# Patient Record
Sex: Male | Born: 1970 | Race: White | Hispanic: No | Marital: Married | Smoking: Former smoker
Health system: Southern US, Community
[De-identification: ages and names within clinical notes are randomized; demographics above are authoritative.]

## PROBLEM LIST (undated history)

## (undated) DIAGNOSIS — Z789 Other specified health status: Secondary | ICD-10-CM

## (undated) DIAGNOSIS — Z460 Encounter for fitting and adjustment of spectacles and contact lenses: Secondary | ICD-10-CM

## (undated) HISTORY — PX: WISDOM TOOTH EXTRACTION: SHX21

## (undated) HISTORY — PX: APPENDECTOMY: SHX54

---

## 2003-11-19 ENCOUNTER — Ambulatory Visit (HOSPITAL_COMMUNITY): Admission: RE | Admit: 2003-11-19 | Discharge: 2003-11-19 | Payer: Self-pay | Admitting: Family Medicine

## 2004-07-01 HISTORY — PX: KNEE ARTHROSCOPY: SUR90

## 2007-01-14 ENCOUNTER — Ambulatory Visit (HOSPITAL_COMMUNITY): Admission: RE | Admit: 2007-01-14 | Discharge: 2007-01-14 | Payer: Self-pay | Admitting: Family Medicine

## 2013-06-10 ENCOUNTER — Other Ambulatory Visit: Payer: Self-pay | Admitting: Family Medicine

## 2013-06-10 DIAGNOSIS — M25562 Pain in left knee: Secondary | ICD-10-CM

## 2013-06-14 ENCOUNTER — Ambulatory Visit
Admission: RE | Admit: 2013-06-14 | Discharge: 2013-06-14 | Disposition: A | Discharge status: Home / Self Care | Payer: BC Managed Care – PPO | Source: Ambulatory Visit | Attending: Family Medicine | Admitting: Family Medicine

## 2013-06-14 DIAGNOSIS — M25562 Pain in left knee: Secondary | ICD-10-CM

## 2013-06-18 ENCOUNTER — Other Ambulatory Visit: Payer: Self-pay | Admitting: Orthopedic Surgery

## 2013-06-21 NOTE — H&P (Signed)
Logan Moore is an 42 y.o. male.   Chief Complaint: Left knee Pain  HPI: Patient is seen in consultation from Dr. Althea Moore for an MRI proven left knee medial meniscal tear that is complex and relatively large in size.  He is limited this for many months he has had various forms of conservative treatment and unfortunately his pain and catching persists, he is here and she isn't having arthroscopic surgery on his left knee.  He had a similar procedure on his right knee around 2007 and did well.  No past medical history on file.  No past surgical history on file.  No family history on file. Social History:  has no tobacco, alcohol, and drug history on file.  Allergies: Allergies not on file  No prescriptions prior to admission    No results found for this or any previous visit (from the past 48 hour(s)). No results found.  Review of Systems  Constitutional: Negative.   HENT: Negative.   Eyes: Negative.   Respiratory: Negative.   Cardiovascular: Negative.   Gastrointestinal: Negative.   Genitourinary: Negative.   Musculoskeletal: Positive for joint pain.  Skin: Negative.   Neurological: Negative.   Endo/Heme/Allergies: Negative.   Psychiatric/Behavioral: Negative.     There were no vitals taken for this visit. Physical Exam  Constitutional: He is oriented to person, place, and time. He appears well-developed and well-nourished.  HENT:  Head: Normocephalic and atraumatic.  Eyes: Pupils are equal, round, and reactive to light.  Neck: Normal range of motion. Neck supple.  Cardiovascular: Intact distal pulses.   Respiratory: Effort normal.  Musculoskeletal:  Tender along the medial joint line of the left knee 1+ effusion McMurray's test reproduces pain without any specific click.  Collateral ligaments are stable Lachman's test is negative.  I did call up the MRI scan on the computer and he doesn't fact have a fairly large complex medial meniscus tear that is visible on 4 or 5 cuts  on the sagittal views.  He is neurovascularly intact.  Neurological: He is alert and oriented to person, place, and time.  Skin: Skin is warm and dry.  Psychiatric: He has a normal mood and affect. His behavior is normal. Judgment and thought content normal.     Assessment/Plan Assess: Large complex left knee medial meniscal tear that has had a full course of conservative treatment and remains symptomatic  Plan: Risks and benefits of arthroscopic surgery discussed and reinforced with the patient.  I will see him back at the time of surgical intervention.  He can anticipate being on light duty for the first week or 2 after the surgery.  Fortunately his job does involve sitting and standing and some driving but he has control over how much.  Logan Moore R 06/21/2013, 2:59 PM

## 2013-06-22 ENCOUNTER — Encounter (HOSPITAL_BASED_OUTPATIENT_CLINIC_OR_DEPARTMENT_OTHER): Payer: Self-pay | Admitting: *Deleted

## 2013-06-22 NOTE — Progress Notes (Signed)
No labs needed

## 2013-06-28 ENCOUNTER — Ambulatory Visit (HOSPITAL_BASED_OUTPATIENT_CLINIC_OR_DEPARTMENT_OTHER): Payer: BC Managed Care – PPO | Admitting: Certified Registered"

## 2013-06-28 ENCOUNTER — Encounter (HOSPITAL_BASED_OUTPATIENT_CLINIC_OR_DEPARTMENT_OTHER): Payer: BC Managed Care – PPO | Admitting: Certified Registered"

## 2013-06-28 ENCOUNTER — Encounter (HOSPITAL_BASED_OUTPATIENT_CLINIC_OR_DEPARTMENT_OTHER)
Admission: RE | Disposition: A | Discharge status: Home / Self Care | Payer: Self-pay | Source: Ambulatory Visit | Attending: Orthopedic Surgery

## 2013-06-28 ENCOUNTER — Ambulatory Visit (HOSPITAL_BASED_OUTPATIENT_CLINIC_OR_DEPARTMENT_OTHER)
Admission: RE | Admit: 2013-06-28 | Discharge: 2013-06-28 | Disposition: A | Discharge status: Home / Self Care | Payer: BC Managed Care – PPO | Source: Ambulatory Visit | Attending: Orthopedic Surgery | Admitting: Orthopedic Surgery

## 2013-06-28 ENCOUNTER — Encounter (HOSPITAL_BASED_OUTPATIENT_CLINIC_OR_DEPARTMENT_OTHER): Payer: Self-pay | Admitting: Certified Registered"

## 2013-06-28 DIAGNOSIS — M25469 Effusion, unspecified knee: Secondary | ICD-10-CM | POA: Insufficient documentation

## 2013-06-28 DIAGNOSIS — Z87891 Personal history of nicotine dependence: Secondary | ICD-10-CM | POA: Insufficient documentation

## 2013-06-28 DIAGNOSIS — IMO0002 Reserved for concepts with insufficient information to code with codable children: Secondary | ICD-10-CM | POA: Insufficient documentation

## 2013-06-28 DIAGNOSIS — S83207A Unspecified tear of unspecified meniscus, current injury, left knee, initial encounter: Secondary | ICD-10-CM

## 2013-06-28 DIAGNOSIS — X58XXXA Exposure to other specified factors, initial encounter: Secondary | ICD-10-CM | POA: Insufficient documentation

## 2013-06-28 HISTORY — PX: KNEE ARTHROSCOPY: SHX127

## 2013-06-28 HISTORY — DX: Encounter for fitting and adjustment of spectacles and contact lenses: Z46.0

## 2013-06-28 HISTORY — DX: Other specified health status: Z78.9

## 2013-06-28 LAB — POCT HEMOGLOBIN-HEMACUE: Hemoglobin: 14.9 g/dL (ref 13.0–17.0)

## 2013-06-28 SURGERY — ARTHROSCOPY, KNEE
Anesthesia: General | Site: Knee | Laterality: Left

## 2013-06-28 MED ORDER — FENTANYL CITRATE 0.05 MG/ML IJ SOLN
INTRAMUSCULAR | Status: AC
  Filled 2013-06-28: qty 4

## 2013-06-28 MED ORDER — LIDOCAINE HCL (CARDIAC) 20 MG/ML IV SOLN
INTRAVENOUS | Status: DC | PRN
  Administered 2013-06-28: 60 mg via INTRAVENOUS

## 2013-06-28 MED ORDER — MIDAZOLAM HCL 5 MG/5ML IJ SOLN
INTRAMUSCULAR | Status: DC | PRN
  Administered 2013-06-28: 2 mg via INTRAVENOUS

## 2013-06-28 MED ORDER — DEXTROSE-NACL 5-0.45 % IV SOLN: INTRAVENOUS | Status: DC

## 2013-06-28 MED ORDER — CHLORHEXIDINE GLUCONATE 4 % EX LIQD: 60.0000 mL | Freq: Once | CUTANEOUS | Status: DC

## 2013-06-28 MED ORDER — SODIUM CHLORIDE 0.9 % IR SOLN
Status: DC | PRN
  Administered 2013-06-28: 6000 mL

## 2013-06-28 MED ORDER — PROPOFOL 10 MG/ML IV BOLUS
INTRAVENOUS | Status: AC
  Filled 2013-06-28: qty 20

## 2013-06-28 MED ORDER — FENTANYL CITRATE 0.05 MG/ML IJ SOLN
INTRAMUSCULAR | Status: DC | PRN
  Administered 2013-06-28: 50 ug via INTRAVENOUS

## 2013-06-28 MED ORDER — MIDAZOLAM HCL 2 MG/2ML IJ SOLN
INTRAMUSCULAR | Status: AC
  Filled 2013-06-28: qty 2

## 2013-06-28 MED ORDER — MIDAZOLAM HCL 2 MG/2ML IJ SOLN: 1.0000 mg | INTRAMUSCULAR | Status: DC | PRN

## 2013-06-28 MED ORDER — LACTATED RINGERS IV SOLN
INTRAVENOUS | Status: DC
  Administered 2013-06-28 (×2): via INTRAVENOUS

## 2013-06-28 MED ORDER — PROPOFOL 10 MG/ML IV BOLUS
INTRAVENOUS | Status: DC | PRN
  Administered 2013-06-28: 200 mg via INTRAVENOUS

## 2013-06-28 MED ORDER — OXYCODONE HCL 5 MG/5ML PO SOLN: 5.0000 mg | Freq: Once | ORAL | Status: AC | PRN

## 2013-06-28 MED ORDER — OXYCODONE HCL 5 MG PO TABS
ORAL_TABLET | ORAL | Status: AC
  Filled 2013-06-28: qty 1

## 2013-06-28 MED ORDER — LIDOCAINE HCL (PF) 1 % IJ SOLN
INTRAMUSCULAR | Status: AC
  Filled 2013-06-28: qty 30

## 2013-06-28 MED ORDER — BUPIVACAINE-EPINEPHRINE PF 0.5-1:200000 % IJ SOLN
INTRAMUSCULAR | Status: AC
  Filled 2013-06-28: qty 30

## 2013-06-28 MED ORDER — DEXAMETHASONE SODIUM PHOSPHATE 10 MG/ML IJ SOLN
INTRAMUSCULAR | Status: DC | PRN
  Administered 2013-06-28: 10 mg via INTRAVENOUS

## 2013-06-28 MED ORDER — HYDROCODONE-ACETAMINOPHEN 5-325 MG PO TABS: 1.0000 | ORAL_TABLET | Freq: Four times a day (QID) | ORAL | Status: AC | PRN

## 2013-06-28 MED ORDER — HYDROMORPHONE HCL PF 1 MG/ML IJ SOLN: 0.2500 mg | INTRAMUSCULAR | Status: DC | PRN

## 2013-06-28 MED ORDER — BUPIVACAINE HCL (PF) 0.5 % IJ SOLN
INTRAMUSCULAR | Status: AC
  Filled 2013-06-28: qty 30

## 2013-06-28 MED ORDER — CEFAZOLIN SODIUM-DEXTROSE 2-3 GM-% IV SOLR: 2.0000 g | INTRAVENOUS | Status: DC

## 2013-06-28 MED ORDER — FENTANYL CITRATE 0.05 MG/ML IJ SOLN: 50.0000 ug | INTRAMUSCULAR | Status: DC | PRN

## 2013-06-28 MED ORDER — OXYCODONE HCL 5 MG PO TABS
5.0000 mg | ORAL_TABLET | Freq: Once | ORAL | Status: AC | PRN
  Administered 2013-06-28: 5 mg via ORAL

## 2013-06-28 MED ORDER — ONDANSETRON HCL 4 MG/2ML IJ SOLN
INTRAMUSCULAR | Status: DC | PRN
  Administered 2013-06-28: 4 mg via INTRAVENOUS

## 2013-06-28 MED ORDER — PROMETHAZINE HCL 25 MG/ML IJ SOLN: 6.2500 mg | INTRAMUSCULAR | Status: DC | PRN

## 2013-06-28 MED ORDER — EPINEPHRINE HCL 1 MG/ML IJ SOLN
INTRAMUSCULAR | Status: AC
  Filled 2013-06-28: qty 1

## 2013-06-28 MED ORDER — BUPIVACAINE-EPINEPHRINE 0.5% -1:200000 IJ SOLN
INTRAMUSCULAR | Status: DC | PRN
  Administered 2013-06-28: 20 mL

## 2013-06-28 MED ORDER — EPINEPHRINE HCL 1 MG/ML IJ SOLN
INTRAMUSCULAR | Status: DC | PRN
  Administered 2013-06-28: 1 mg

## 2013-06-28 SURGICAL SUPPLY — 40 items
BANDAGE ELASTIC 6 VELCRO ST LF (GAUZE/BANDAGES/DRESSINGS) ×2 IMPLANT
BLADE 4.2CUDA (BLADE) IMPLANT
BLADE CUTTER GATOR 3.5 (BLADE) ×1 IMPLANT
BLADE GREAT WHITE 4.2 (BLADE) IMPLANT
CANISTER SUCT 3000ML (MISCELLANEOUS) IMPLANT
CANISTER SUCT LVC 12 LTR MEDI- (MISCELLANEOUS) ×1 IMPLANT
CHLORAPREP W/TINT 26ML (MISCELLANEOUS) ×2 IMPLANT
DRAPE ARTHROSCOPY W/POUCH 114 (DRAPES) ×2 IMPLANT
ELECT MENISCUS 165MM 90D (ELECTRODE) IMPLANT
ELECT REM PT RETURN 9FT ADLT (ELECTROSURGICAL)
ELECTRODE REM PT RTRN 9FT ADLT (ELECTROSURGICAL) IMPLANT
GAUZE XEROFORM 1X8 LF (GAUZE/BANDAGES/DRESSINGS) ×2 IMPLANT
GLOVE BIO SURGEON STRL SZ7.5 (GLOVE) ×2 IMPLANT
GLOVE BIO SURGEON STRL SZ8.5 (GLOVE) ×2 IMPLANT
GLOVE BIOGEL PI IND STRL 7.0 (GLOVE) IMPLANT
GLOVE BIOGEL PI IND STRL 8 (GLOVE) ×1 IMPLANT
GLOVE BIOGEL PI IND STRL 9 (GLOVE) ×1 IMPLANT
GLOVE BIOGEL PI INDICATOR 7.0 (GLOVE) ×1
GLOVE BIOGEL PI INDICATOR 8 (GLOVE) ×1
GLOVE BIOGEL PI INDICATOR 9 (GLOVE) ×1
GLOVE ECLIPSE 6.5 STRL STRAW (GLOVE) ×1 IMPLANT
GOWN PREVENTION PLUS XLARGE (GOWN DISPOSABLE) ×3 IMPLANT
GOWN PREVENTION PLUS XXLARGE (GOWN DISPOSABLE) ×2 IMPLANT
IV NS IRRIG 3000ML ARTHROMATIC (IV SOLUTION) ×2 IMPLANT
KNEE WRAP E Z 3 GEL PACK (MISCELLANEOUS) ×2 IMPLANT
NDL SAFETY ECLIPSE 18X1.5 (NEEDLE) ×1 IMPLANT
NEEDLE HYPO 18GX1.5 SHARP (NEEDLE) ×2
PACK ARTHROSCOPY DSU (CUSTOM PROCEDURE TRAY) ×2 IMPLANT
PACK BASIN DAY SURGERY FS (CUSTOM PROCEDURE TRAY) ×2 IMPLANT
PAD ALCOHOL SWAB (MISCELLANEOUS) ×2 IMPLANT
PENCIL BUTTON HOLSTER BLD 10FT (ELECTRODE) IMPLANT
SET ARTHROSCOPY TUBING (MISCELLANEOUS) ×2
SET ARTHROSCOPY TUBING LN (MISCELLANEOUS) ×1 IMPLANT
SLEEVE SCD COMPRESS KNEE MED (MISCELLANEOUS) IMPLANT
SPONGE GAUZE 4X4 12PLY (GAUZE/BANDAGES/DRESSINGS) ×2 IMPLANT
SYR 3ML 18GX1 1/2 (SYRINGE) IMPLANT
SYR 5ML LL (SYRINGE) ×2 IMPLANT
TOWEL OR 17X24 6PK STRL BLUE (TOWEL DISPOSABLE) ×2 IMPLANT
WAND STAR VAC 90 (SURGICAL WAND) IMPLANT
WATER STERILE IRR 1000ML POUR (IV SOLUTION) ×2 IMPLANT

## 2013-06-28 NOTE — Op Note (Signed)
Pre-Op Dx: Left knee medial meniscal tear  Postop Dx: Same   Procedure: Left knee partial arthroscopic medial meniscectomy  Surgeon: Feliberto Gottron. Turner Daniels M.D.  Assist: Tomi Likens. Gaylene Brooks  (present throughout entire procedure and necessary for timely completion of the procedure) Anes: General LMA  EBL: Minimal  Fluids: 800 cc   Indications: Patient seen in consultation from Dr. Althea Charon for MRI proven medial meniscal tear of the left knee posterior horn complex multiplane. Pt has failed conservative treatment with anti-inflammatory medicines, physical therapy, and modified activites but did get good temporarily from an intra-articular cortisone injection. Pain has recurred and patient desires elective arthroscopic evaluation and treatment of knee. Risks and benefits of surgery have been discussed and questions answered.  Procedure: Patient identified by arm band and taken to the operating room at the day [facility]. The appropriate anesthetic monitors were attached, and General LMA anesthesia was induced without difficulty. Lateral post was applied to the table and the lower extremity was prepped and draped in usual sterile fashion from the ankle to the midthigh. Time out procedure was performed. We began the operation by making standard inferior lateral and inferior medial peripatellar portals with a #11 blade allowing introduction of the arthroscope through the inferior lateral portal and the out flow to the inferior medial portal. Pump pressure was set at 100 mmHg and diagnostic arthroscopy  revealed normal patellofemoral joint, normal lateral compartment, normal anterior cruciate ligament and PCL. The medial compartment had a posterior horn medial meniscal tear near bucket-handle with a double parrot-beak component posteriorly in a horizontal cleavage tear as well. The meniscal tear was removed with straight biters and a 35 Gator sucker shaver. The knee was irrigated out normal saline solution. A  dressing of xerofoam 4 x 4 dressing sponges, web roll and an Ace wrap was applied. The patient was awakened extubated and taken to the recovery without difficulty.    Signed: Nestor Lewandowsky, MD

## 2013-06-28 NOTE — Interval H&P Note (Signed)
History and Physical Interval Note:  06/28/2013 9:39 AM  Logan Moore  has presented today for surgery, with the diagnosis of Left Knee Medial Mensical Tear  The various methods of treatment have been discussed with the patient and family. After consideration of risks, benefits and other options for treatment, the patient has consented to  Procedure(s): ARTHROSCOPY LEFT KNEE (Left) as a surgical intervention .  The patient's history has been reviewed, patient examined, no change in status, stable for surgery.  I have reviewed the patient's chart and labs.  Questions were answered to the patient's satisfaction.     Nestor Lewandowsky

## 2013-06-28 NOTE — Anesthesia Postprocedure Evaluation (Signed)
  Anesthesia Post-op Note  Patient: Logan Moore  Procedure(s) Performed: Procedure(s): ARTHROSCOPY LEFT KNEE, Lateral menisectomy (Left)  Patient Location: PACU  Anesthesia Type:General  Level of Consciousness: awake and alert   Airway and Oxygen Therapy: Patient Spontanous Breathing  Post-op Pain: mild  Post-op Assessment: Post-op Vital signs reviewed  Post-op Vital Signs: stable  Complications: No apparent anesthesia complications

## 2013-06-28 NOTE — Anesthesia Procedure Notes (Signed)
Procedure Name: LMA Insertion Performed by: Rasheena Talmadge Pre-anesthesia Checklist: Patient identified, Emergency Drugs available, Suction available and Patient being monitored Patient Re-evaluated:Patient Re-evaluated prior to inductionOxygen Delivery Method: Circle System Utilized Preoxygenation: Pre-oxygenation with 100% oxygen Intubation Type: IV induction Ventilation: Mask ventilation without difficulty LMA: LMA inserted LMA Size: 5.0 Number of attempts: 1 Airway Equipment and Method: bite block Placement Confirmation: positive ETCO2 Tube secured with: Tape Dental Injury: Teeth and Oropharynx as per pre-operative assessment      

## 2013-06-28 NOTE — Transfer of Care (Signed)
Immediate Anesthesia Transfer of Care Note  Patient: Logan Moore  Procedure(s) Performed: Procedure(s): ARTHROSCOPY LEFT KNEE, Lateral menisectomy (Left)  Patient Location: PACU  Anesthesia Type:General  Level of Consciousness: awake and patient cooperative  Airway & Oxygen Therapy: Patient Spontanous Breathing and Patient connected to face mask oxygen  Post-op Assessment: Report given to PACU RN and Post -op Vital signs reviewed and stable  Post vital signs: Reviewed and stable  Complications: No apparent anesthesia complications

## 2013-06-28 NOTE — Anesthesia Preprocedure Evaluation (Addendum)
Anesthesia Evaluation  Patient identified by MRN, date of birth, ID band Patient awake    Reviewed: Allergy & Precautions, NPO status   Airway Mallampati: I      Dental  (+) Teeth Intact   Pulmonary former smoker,  breath sounds clear to auscultation        Cardiovascular Rhythm:Regular Rate:Normal     Neuro/Psych    GI/Hepatic   Endo/Other    Renal/GU      Musculoskeletal   Abdominal   Peds  Hematology   Anesthesia Other Findings   Reproductive/Obstetrics                         Anesthesia Physical Anesthesia Plan  ASA: I  Anesthesia Plan: General   Post-op Pain Management:    Induction: Intravenous  Airway Management Planned: LMA  Additional Equipment:   Intra-op Plan:   Post-operative Plan: Extubation in OR  Informed Consent: I have reviewed the patients History and Physical, chart, labs and discussed the procedure including the risks, benefits and alternatives for the proposed anesthesia with the patient or authorized representative who has indicated his/her understanding and acceptance.     Plan Discussed with: CRNA and Surgeon  Anesthesia Plan Comments:         Anesthesia Quick Evaluation

## 2013-06-29 ENCOUNTER — Encounter (HOSPITAL_BASED_OUTPATIENT_CLINIC_OR_DEPARTMENT_OTHER): Payer: Self-pay | Admitting: Orthopedic Surgery

## 2017-08-11 ENCOUNTER — Other Ambulatory Visit: Payer: Self-pay | Admitting: Family Medicine

## 2017-08-11 ENCOUNTER — Ambulatory Visit
Admission: RE | Admit: 2017-08-11 | Discharge: 2017-08-11 | Disposition: A | Discharge status: Home / Self Care | Payer: Managed Care, Other (non HMO) | Source: Ambulatory Visit | Attending: Family Medicine | Admitting: Family Medicine

## 2017-08-11 DIAGNOSIS — M79671 Pain in right foot: Secondary | ICD-10-CM

## 2019-04-22 IMAGING — CR DG FOOT COMPLETE 3+V*R*
3 series · 3 of 3 positions shown · non-contrast
Comparison: None.

CLINICAL DATA: 46-year-old male with twisting injury to right foot
on trampoline 3 weeks ago. Medial pain, medial tarsal pain with
bruising and swelling.

EXAM:
RIGHT FOOT COMPLETE - 3+ VIEW

[t foot ap right *]
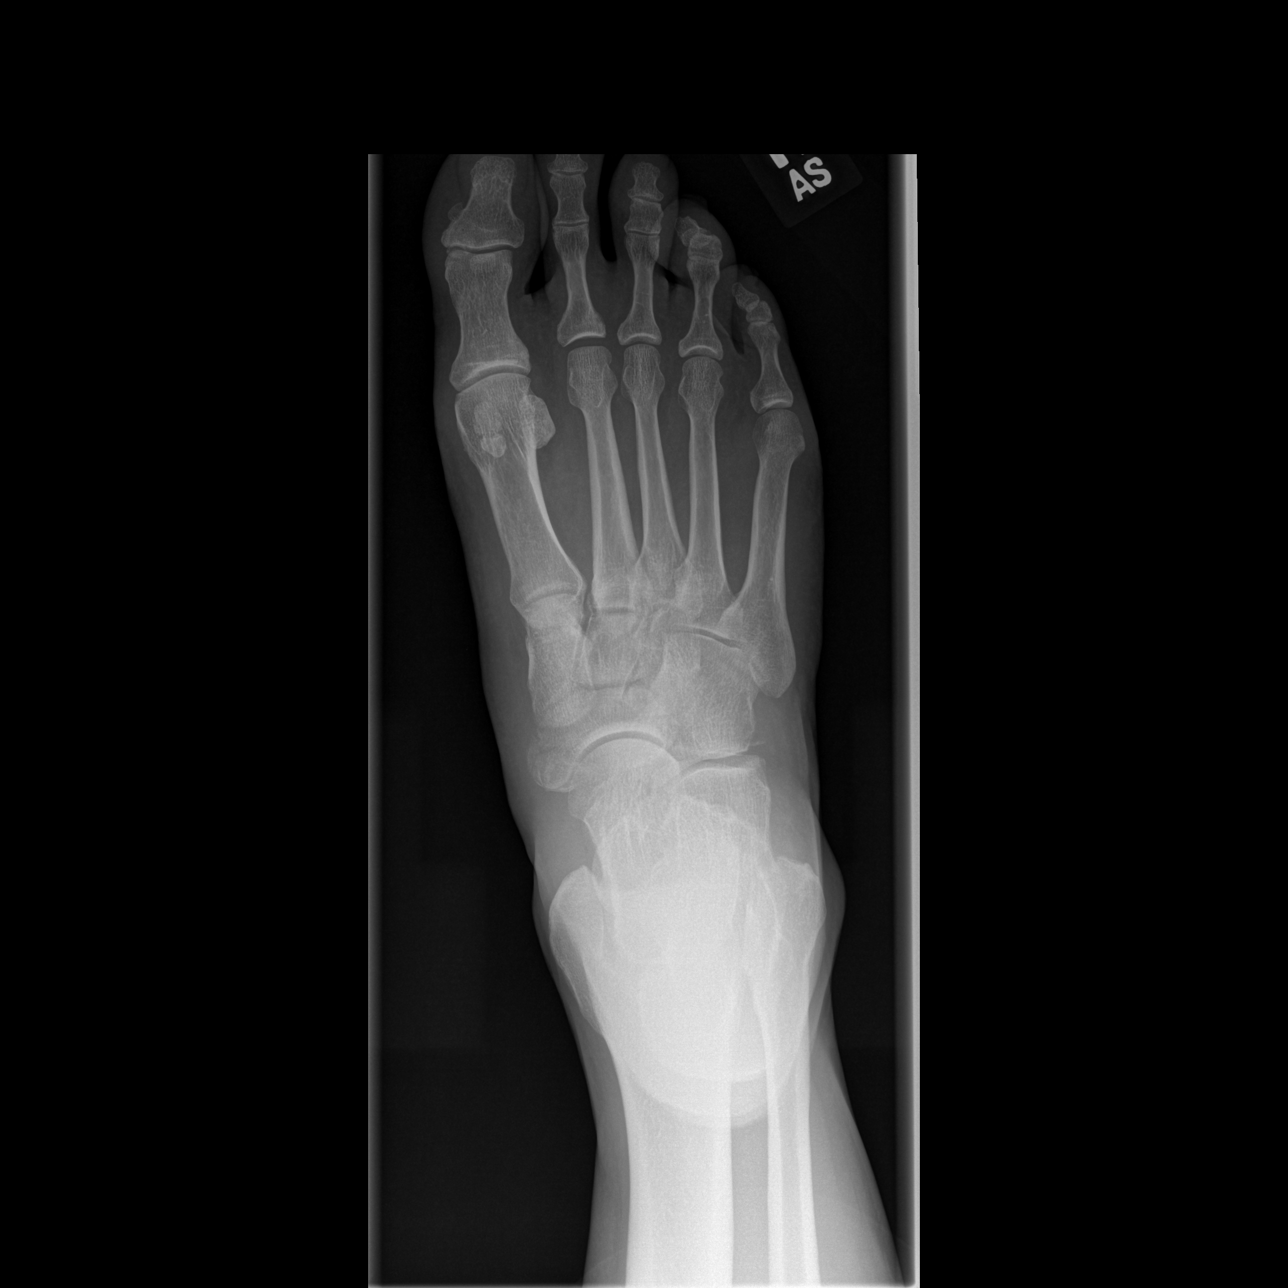

[t foot oblique right *]
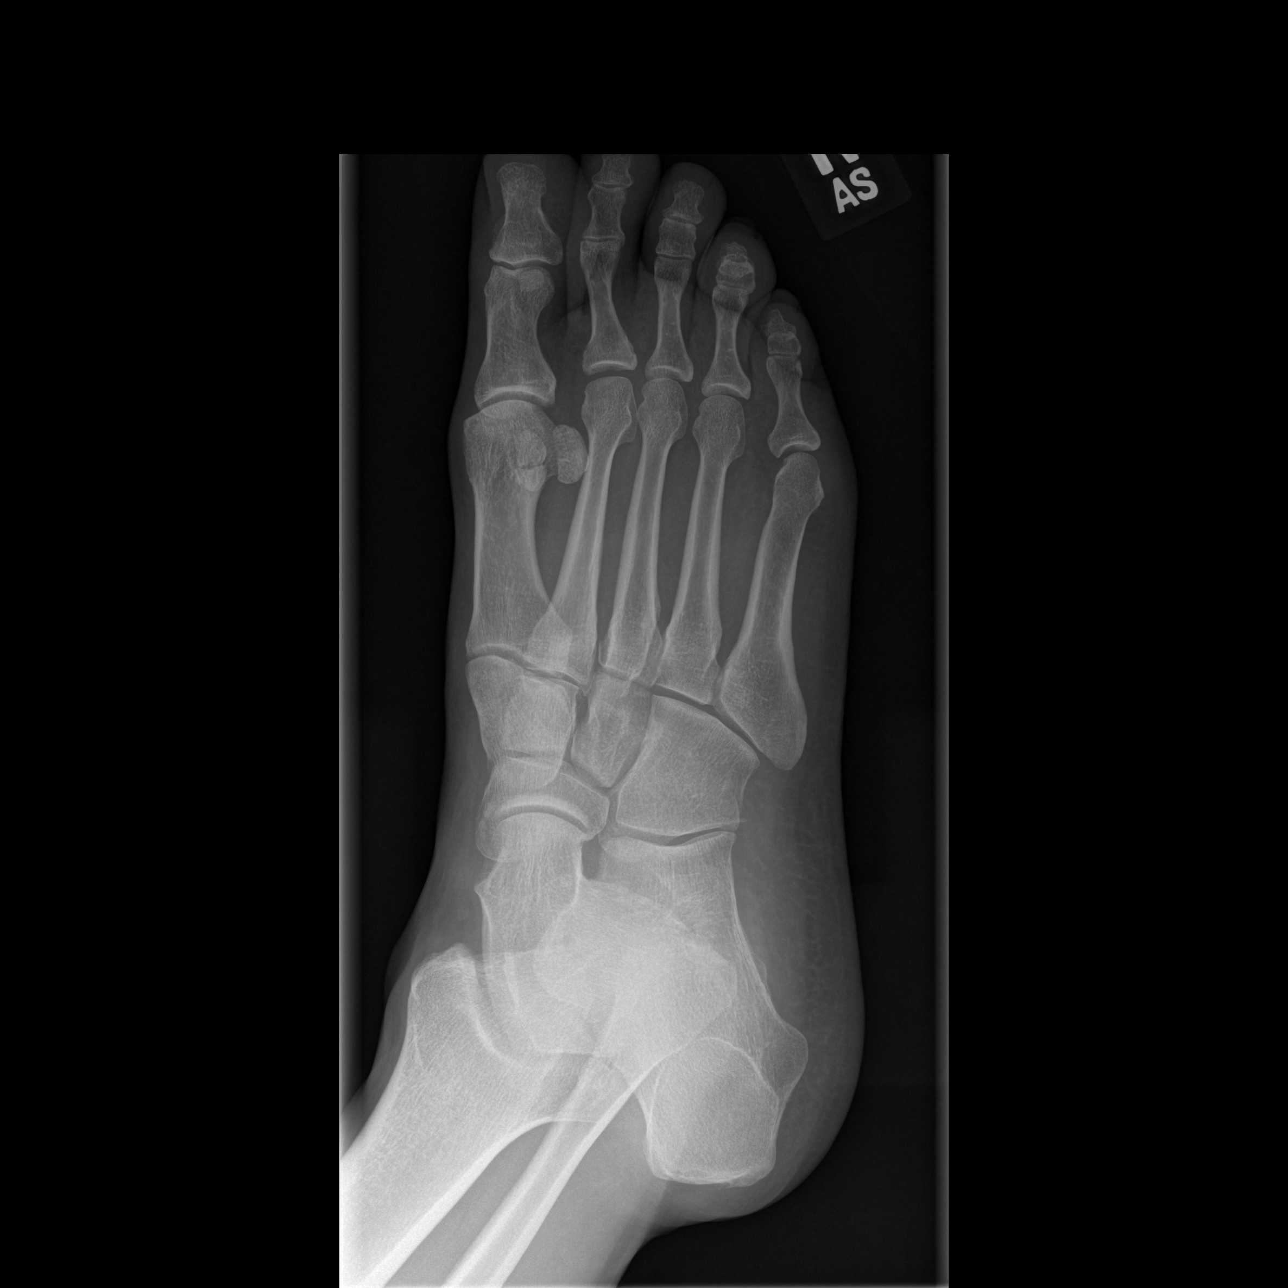

[t foot lat right *]
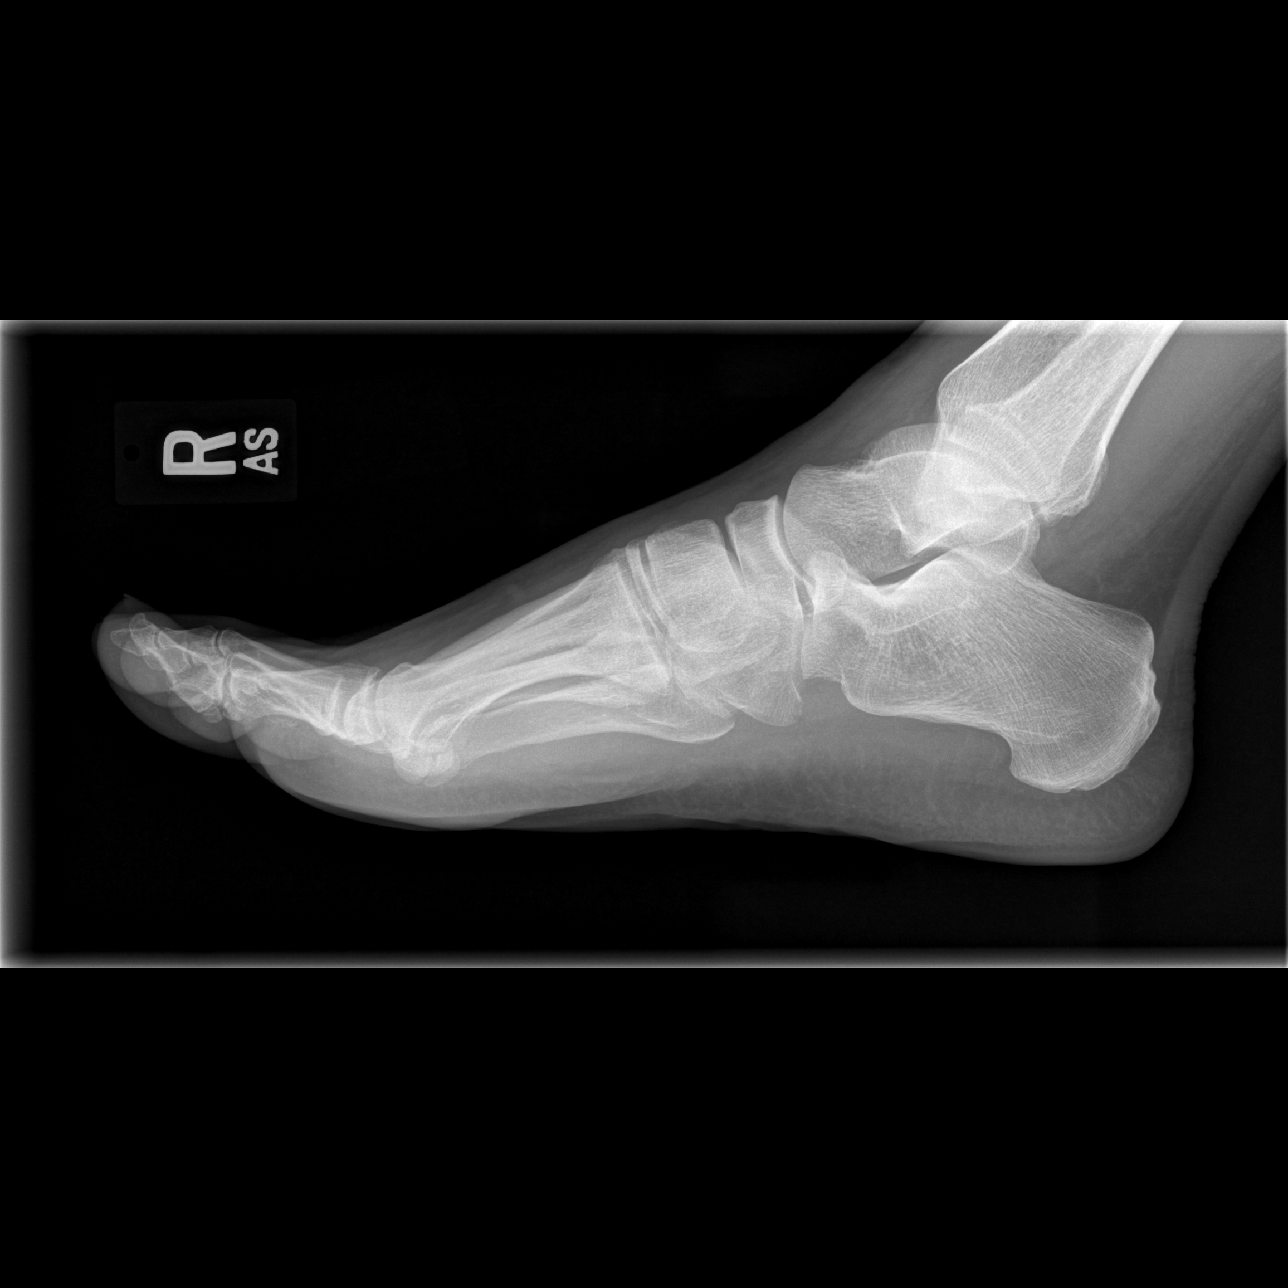

[3 of 3 positions shown; findings below may reference images not displayed]

FINDINGS: Cortical irregularity along the medial aspect of the navicular bone
is compatible with nondisplaced comminuted fracture (arrows). This
may be partially intra-articular at the talonavicular joint.

Underlying bone mineralization is normal and the remaining tarsal
bones appear intact. Normal joint spaces and alignment throughout
the right foot. Metatarsals and phalanges appear intact.
IMPRESSION: 1. Comminuted and nondisplaced but possibly intra-articular fracture
of the medial navicular.
2. No other osseous abnormality identified.
